# Patient Record
Sex: Male | Born: 1990 | Race: Black or African American | Hispanic: No | Marital: Single | Smoking: Current every day smoker
Health system: Southern US, Community
[De-identification: ages and names within clinical notes are randomized; demographics above are authoritative.]

---

## 2017-11-01 ENCOUNTER — Emergency Department (HOSPITAL_COMMUNITY)
Admission: EM | Admit: 2017-11-01 | Discharge: 2017-11-02 | Disposition: A | Discharge status: Home / Self Care | Payer: Self-pay | Attending: Emergency Medicine | Admitting: Emergency Medicine

## 2017-11-01 ENCOUNTER — Encounter (HOSPITAL_COMMUNITY): Payer: Self-pay | Admitting: Emergency Medicine

## 2017-11-01 ENCOUNTER — Other Ambulatory Visit: Payer: Self-pay

## 2017-11-01 ENCOUNTER — Emergency Department (HOSPITAL_COMMUNITY): Payer: Self-pay

## 2017-11-01 DIAGNOSIS — L02611 Cutaneous abscess of right foot: Secondary | ICD-10-CM | POA: Insufficient documentation

## 2017-11-01 MED ORDER — CEPHALEXIN 500 MG PO CAPS
1000.0000 mg | ORAL_CAPSULE | Freq: Once | ORAL | Status: AC
  Administered 2017-11-02: 1000 mg via ORAL
  Filled 2017-11-01: qty 2

## 2017-11-01 MED ORDER — OXYCODONE-ACETAMINOPHEN 5-325 MG PO TABS
1.0000 | ORAL_TABLET | Freq: Once | ORAL | Status: AC
  Administered 2017-11-02: 1 via ORAL
  Filled 2017-11-01: qty 1

## 2017-11-01 NOTE — ED Triage Notes (Signed)
Patient reports kicking chair yesterday. Wound between right fourth and fifth toes. Warmth, redness and swelling worsening to foot since yesterday. Pulse present in right foot. Ambulatory.

## 2017-11-01 NOTE — ED Provider Notes (Signed)
[facility]-EMERGENCY DEPT Provider Note   CSN: [id number] Arrival date & time: 11/01/17  1856     History   Chief Complaint Chief Complaint  Patient presents with  . Foot Injury    HPI Anthony Pineda is a 27 y.o. male.  Patient without significant medical history presents with right foot pain, swelling and redness since 2 days ago when he hit the foot against a piece of furniture causing injury to 4th and 5th toes. There was no wound or bleeding secondary to that injury. Since then, his foot has become warm to touch, red over the dorsal foot with blister developing at the interphalangeal space between the 4th and 5th toes. No fever, nausea or vomiting. No drainage.  The history is provided by the patient. No language interpreter was used.  Foot Injury   Pertinent negatives include no numbness.    History reviewed. No pertinent past medical history.  There are no active problems to display for this patient.   History reviewed. No pertinent surgical history.      Home Medications    Prior to Admission medications   Not on File    Family History No family history on file.  Social History Social History   Tobacco Use  . Smoking status: Not on file  Substance Use Topics  . Alcohol use: Not on file  . Drug use: Not on file     Allergies   Patient has no known allergies.   Review of Systems Review of Systems  Constitutional: Negative for chills and fever.  Gastrointestinal: Negative.  Negative for nausea and vomiting.  Musculoskeletal:       See HPI.  Skin: Positive for color change and wound.  Neurological: Negative.  Negative for weakness and numbness.     Physical Exam Updated Vital Signs BP (!) 141/84   Pulse 91   Temp 98.6 F (37 C) (Oral)   Resp 17   SpO2 100%   Physical Exam  Constitutional: He is oriented to person, place, and time. He appears well-developed and well-nourished.  Neck: Normal range of motion.    Pulmonary/Chest: Effort normal.  Musculoskeletal: Normal range of motion.  Right foot swollen distally to mid-forefoot, red, warm. No bony deformity.   Neurological: He is alert and oriented to person, place, and time.  Skin: Skin is warm and dry.  Erythema over mid- to distal dorsal forefoot on right. Thre is a blister at the interphalangeal space between 4th and 5th toes that is fluctuant, c/w abscess.  Psychiatric: He has a normal mood and affect.     ED Treatments / Results  Labs (all labs ordered are listed, but only abnormal results are displayed) Labs Reviewed - No data to display  EKG None  Radiology No results found.  Procedures .Marland KitchenIncision and Drainage Date/Time: 11/02/2017 1:38 AM Performed by: Elpidio Anis, PA-C Authorized by: Elpidio Anis, PA-C   Consent:    Consent obtained:  Verbal   Consent given by:  Patient Location:    Type:  Abscess   Location:  Lower extremity   Lower extremity location:  Foot   Foot location:  R foot Pre-procedure details:    Skin preparation:  Betadine Anesthesia (see MAR for exact dosages):    Anesthesia method:  Local infiltration   Local anesthetic:  Lidocaine 1% w/o epi Procedure type:    Complexity:  Simple Procedure details:    Needle aspiration: no     Incision types:  Stab incision  Scalpel blade:  11   Drainage:  Purulent and bloody   Drainage amount:  Moderate   Wound treatment:  Wound left open   Packing materials:  None Post-procedure details:    Patient tolerance of procedure:  Tolerated well, no immediate complications   (including critical care time)  Medications Ordered in ED Medications  oxyCODONE-acetaminophen (PERCOCET/ROXICET) 5-325 MG per tablet 1 tablet (has no administration in time range)  cephALEXin (KEFLEX) capsule 1,000 mg (has no administration in time range)     Initial Impression / Assessment and Plan / ED Course  I have reviewed the triage vital signs and the nursing  notes.  Pertinent labs & imaging results that were available during my care of the patient were reviewed by me and considered in my medical decision making (see chart for details).     Patient with right foot pain, swelling and redness with abscess at 4th-5th interphalangeal space, opened and drained per above procedure note. Imaging negative for bony injury.  Patient started on Keflex, given care instructions. Strongly encouraged 2 day recheck of infection.   Final Clinical Impressions(s) / ED Diagnoses   Final diagnoses:  None   1. Right foot abscess  ED Discharge Orders    None       Elpidio Anis, PA-C 11/02/17 0149    Gilda Crease, MD 11/02/17 [phone]

## 2017-11-02 MED ORDER — CEPHALEXIN 500 MG PO CAPS: 1000.0000 mg | ORAL_CAPSULE | Freq: Two times a day (BID) | ORAL | 0 refills | Status: DC

## 2017-11-02 MED ORDER — IBUPROFEN 600 MG PO TABS: 600.0000 mg | ORAL_TABLET | Freq: Four times a day (QID) | ORAL | 0 refills | Status: DC | PRN

## 2017-11-02 MED ORDER — LIDOCAINE HCL (PF) 1 % IJ SOLN
5.0000 mL | Freq: Once | INTRAMUSCULAR | Status: AC
  Administered 2017-11-02: 5 mL
  Filled 2017-11-02: qty 30

## 2017-11-02 NOTE — ED Notes (Signed)
Items requested at bedside

## 2017-11-02 NOTE — Discharge Instructions (Addendum)
Take the antibiotic as prescribed. Return here or go to the [facility] center for recheck of infection in 2 days. Return sooner with any worsening symptoms - increasing pain and redness, severe pain, fever.

## 2018-03-25 ENCOUNTER — Other Ambulatory Visit: Payer: Self-pay

## 2018-03-25 ENCOUNTER — Encounter (HOSPITAL_COMMUNITY): Payer: Self-pay | Admitting: Emergency Medicine

## 2018-03-25 ENCOUNTER — Inpatient Hospital Stay (HOSPITAL_COMMUNITY)
Admission: EM | Admit: 2018-03-25 | Discharge: 2018-03-27 | DRG: 439 | Disposition: A | Discharge status: Home / Self Care | Payer: Self-pay | Attending: Internal Medicine | Admitting: Internal Medicine

## 2018-03-25 ENCOUNTER — Emergency Department (HOSPITAL_COMMUNITY): Payer: Self-pay

## 2018-03-25 DIAGNOSIS — R402252 Coma scale, best verbal response, oriented, at arrival to emergency department: Secondary | ICD-10-CM | POA: Diagnosis present

## 2018-03-25 DIAGNOSIS — K852 Alcohol induced acute pancreatitis without necrosis or infection: Secondary | ICD-10-CM

## 2018-03-25 DIAGNOSIS — R402362 Coma scale, best motor response, obeys commands, at arrival to emergency department: Secondary | ICD-10-CM | POA: Diagnosis present

## 2018-03-25 DIAGNOSIS — R402142 Coma scale, eyes open, spontaneous, at arrival to emergency department: Secondary | ICD-10-CM | POA: Diagnosis present

## 2018-03-25 DIAGNOSIS — Z23 Encounter for immunization: Secondary | ICD-10-CM

## 2018-03-25 DIAGNOSIS — F1020 Alcohol dependence, uncomplicated: Secondary | ICD-10-CM | POA: Diagnosis present

## 2018-03-25 DIAGNOSIS — F17210 Nicotine dependence, cigarettes, uncomplicated: Secondary | ICD-10-CM | POA: Diagnosis present

## 2018-03-25 DIAGNOSIS — IMO0001 Reserved for inherently not codable concepts without codable children: Secondary | ICD-10-CM | POA: Diagnosis present

## 2018-03-25 DIAGNOSIS — K859 Acute pancreatitis without necrosis or infection, unspecified: Principal | ICD-10-CM | POA: Diagnosis present

## 2018-03-25 DIAGNOSIS — E871 Hypo-osmolality and hyponatremia: Secondary | ICD-10-CM | POA: Diagnosis present

## 2018-03-25 LAB — CBC WITH DIFFERENTIAL/PLATELET
Abs Immature Granulocytes: 0.09 10*3/uL — ABNORMAL HIGH (ref 0.00–0.07)
BASOS PCT: 0 %
Basophils Absolute: 0 10*3/uL (ref 0.0–0.1)
EOS ABS: 0 10*3/uL (ref 0.0–0.5)
EOS PCT: 0 %
HCT: 52.1 % — ABNORMAL HIGH (ref 39.0–52.0)
Hemoglobin: 17.3 g/dL — ABNORMAL HIGH (ref 13.0–17.0)
Immature Granulocytes: 1 %
Lymphocytes Relative: 11 %
Lymphs Abs: 1.5 10*3/uL (ref 0.7–4.0)
MCH: 30 pg (ref 26.0–34.0)
MCHC: 33.2 g/dL (ref 30.0–36.0)
MCV: 90.3 fL (ref 80.0–100.0)
MONO ABS: 1.1 10*3/uL — AB (ref 0.1–1.0)
Monocytes Relative: 8 %
NEUTROS ABS: 10.6 10*3/uL — AB (ref 1.7–7.7)
Neutrophils Relative %: 80 %
PLATELETS: 68 10*3/uL — AB (ref 150–400)
RBC: 5.77 MIL/uL (ref 4.22–5.81)
RDW: 12.4 % (ref 11.5–15.5)
WBC: 13.3 10*3/uL — AB (ref 4.0–10.5)
nRBC: 0 % (ref 0.0–0.2)

## 2018-03-25 LAB — COMPREHENSIVE METABOLIC PANEL
ALK PHOS: 113 U/L (ref 38–126)
ALT: 40 U/L (ref 0–44)
AST: 65 U/L — ABNORMAL HIGH (ref 15–41)
Albumin: 3.5 g/dL (ref 3.5–5.0)
Anion gap: 11 (ref 5–15)
BUN: 5 mg/dL — AB (ref 6–20)
CALCIUM: 8.6 mg/dL — AB (ref 8.9–10.3)
CO2: 25 mmol/L (ref 22–32)
CREATININE: 1 mg/dL (ref 0.61–1.24)
Chloride: 93 mmol/L — ABNORMAL LOW (ref 98–111)
GFR calc Af Amer: >60 mL/min (ref >60)
GFR calc non Af Amer: >60 mL/min (ref >60)
GLUCOSE: 124 mg/dL — AB (ref 70–99)
Potassium: 3.6 mmol/L (ref 3.5–5.1)
SODIUM: 129 mmol/L — AB (ref 135–145)
Total Bilirubin: 1.4 mg/dL — ABNORMAL HIGH (ref 0.3–1.2)
Total Protein: 7.6 g/dL (ref 6.5–8.1)

## 2018-03-25 LAB — URINALYSIS, ROUTINE W REFLEX MICROSCOPIC
Bacteria, UA: NONE SEEN
Bilirubin Urine: NEGATIVE
Glucose, UA: NEGATIVE mg/dL
Hgb urine dipstick: NEGATIVE
KETONES UR: 5 mg/dL — AB
Leukocytes,Ua: NEGATIVE
Nitrite: NEGATIVE
PH: 5 (ref 5.0–8.0)
PROTEIN: 30 mg/dL — AB
Specific Gravity, Urine: 1.02 (ref 1.005–1.030)

## 2018-03-25 LAB — PROTIME-INR
INR: 1.1 (ref 0.8–1.2)
Prothrombin Time: 14.3 seconds (ref 11.4–15.2)

## 2018-03-25 LAB — CBC
HCT: 52.2 % — ABNORMAL HIGH (ref 39.0–52.0)
Hemoglobin: 17.2 g/dL — ABNORMAL HIGH (ref 13.0–17.0)
MCH: 29.9 pg (ref 26.0–34.0)
MCHC: 33 g/dL (ref 30.0–36.0)
MCV: 90.8 fL (ref 80.0–100.0)
Platelets: 71 10*3/uL — ABNORMAL LOW (ref 150–400)
RBC: 5.75 MIL/uL (ref 4.22–5.81)
RDW: 12.3 % (ref 11.5–15.5)
WBC: 12.8 10*3/uL — ABNORMAL HIGH (ref 4.0–10.5)
nRBC: 0 % (ref 0.0–0.2)

## 2018-03-25 LAB — LIPASE, BLOOD: LIPASE: 390 U/L — AB (ref 11–51)

## 2018-03-25 MED ORDER — SODIUM CHLORIDE 0.9% FLUSH: 3.0000 mL | INTRAVENOUS | Status: DC | PRN

## 2018-03-25 MED ORDER — ONDANSETRON HCL 4 MG/2ML IJ SOLN
4.0000 mg | Freq: Four times a day (QID) | INTRAMUSCULAR | Status: DC | PRN
  Administered 2018-03-25: 4 mg via INTRAVENOUS
  Filled 2018-03-25: qty 2

## 2018-03-25 MED ORDER — ONDANSETRON HCL 4 MG PO TABS: 4.0000 mg | ORAL_TABLET | Freq: Four times a day (QID) | ORAL | Status: DC | PRN

## 2018-03-25 MED ORDER — IOHEXOL 300 MG/ML  SOLN
100.0000 mL | Freq: Once | INTRAMUSCULAR | Status: AC | PRN
  Administered 2018-03-25: 100 mL via INTRAVENOUS

## 2018-03-25 MED ORDER — LORAZEPAM 2 MG/ML IJ SOLN
0.0000 mg | Freq: Four times a day (QID) | INTRAMUSCULAR | Status: DC
  Administered 2018-03-26 (×2): 2 mg via INTRAVENOUS
  Filled 2018-03-25 (×2): qty 1

## 2018-03-25 MED ORDER — SODIUM CHLORIDE 0.9 % IV SOLN: 250.0000 mL | INTRAVENOUS | Status: DC | PRN

## 2018-03-25 MED ORDER — ZOLPIDEM TARTRATE 5 MG PO TABS
5.0000 mg | ORAL_TABLET | Freq: Every evening | ORAL | Status: DC | PRN
  Administered 2018-03-26: 5 mg via ORAL
  Filled 2018-03-25: qty 1

## 2018-03-25 MED ORDER — ACETAMINOPHEN 325 MG PO TABS: 650.0000 mg | ORAL_TABLET | Freq: Four times a day (QID) | ORAL | Status: DC | PRN

## 2018-03-25 MED ORDER — POTASSIUM CHLORIDE 2 MEQ/ML IV SOLN
INTRAVENOUS | Status: DC
  Administered 2018-03-25 – 2018-03-27 (×6): via INTRAVENOUS
  Filled 2018-03-25 (×12): qty 1000

## 2018-03-25 MED ORDER — LORAZEPAM 2 MG/ML IJ SOLN: 0.0000 mg | Freq: Two times a day (BID) | INTRAMUSCULAR | Status: DC

## 2018-03-25 MED ORDER — LORAZEPAM 1 MG PO TABS: 1.0000 mg | ORAL_TABLET | Freq: Four times a day (QID) | ORAL | Status: DC | PRN

## 2018-03-25 MED ORDER — PROMETHAZINE HCL 25 MG/ML IJ SOLN
12.5000 mg | Freq: Once | INTRAMUSCULAR | Status: AC
  Administered 2018-03-25: 12.5 mg via INTRAVENOUS
  Filled 2018-03-25: qty 1

## 2018-03-25 MED ORDER — SODIUM CHLORIDE 0.9% FLUSH
3.0000 mL | Freq: Once | INTRAVENOUS | Status: AC
  Administered 2018-03-25: 3 mL via INTRAVENOUS

## 2018-03-25 MED ORDER — HEPARIN SODIUM (PORCINE) 5000 UNIT/ML IJ SOLN
5000.0000 [IU] | Freq: Three times a day (TID) | INTRAMUSCULAR | Status: DC
  Administered 2018-03-25 – 2018-03-27 (×5): 5000 [IU] via SUBCUTANEOUS
  Filled 2018-03-25 (×6): qty 1

## 2018-03-25 MED ORDER — SODIUM CHLORIDE 0.9 % IV BOLUS
1000.0000 mL | Freq: Once | INTRAVENOUS | Status: AC
  Administered 2018-03-25: 1000 mL via INTRAVENOUS

## 2018-03-25 MED ORDER — THIAMINE HCL 100 MG/ML IJ SOLN
100.0000 mg | Freq: Every day | INTRAMUSCULAR | Status: DC
  Administered 2018-03-25: 100 mg via INTRAVENOUS
  Filled 2018-03-25 (×2): qty 2

## 2018-03-25 MED ORDER — MORPHINE SULFATE (PF) 2 MG/ML IV SOLN
2.0000 mg | INTRAVENOUS | Status: DC | PRN
  Administered 2018-03-25 – 2018-03-27 (×9): 2 mg via INTRAVENOUS
  Filled 2018-03-25 (×9): qty 1

## 2018-03-25 MED ORDER — LORAZEPAM 2 MG/ML IJ SOLN: 1.0000 mg | Freq: Four times a day (QID) | INTRAMUSCULAR | Status: DC | PRN

## 2018-03-25 MED ORDER — MORPHINE SULFATE (PF) 4 MG/ML IV SOLN
4.0000 mg | Freq: Once | INTRAVENOUS | Status: AC
  Administered 2018-03-25: 4 mg via INTRAVENOUS
  Filled 2018-03-25: qty 1

## 2018-03-25 MED ORDER — ACETAMINOPHEN 650 MG RE SUPP: 650.0000 mg | Freq: Four times a day (QID) | RECTAL | Status: DC | PRN

## 2018-03-25 MED ORDER — ONDANSETRON HCL 4 MG/2ML IJ SOLN
4.0000 mg | Freq: Once | INTRAMUSCULAR | Status: AC
  Administered 2018-03-25: 4 mg via INTRAVENOUS
  Filled 2018-03-25: qty 2

## 2018-03-25 MED ORDER — SODIUM CHLORIDE 0.9% FLUSH
3.0000 mL | Freq: Two times a day (BID) | INTRAVENOUS | Status: DC
  Administered 2018-03-25: 3 mL via INTRAVENOUS

## 2018-03-25 MED ORDER — ADULT MULTIVITAMIN W/MINERALS CH
1.0000 | ORAL_TABLET | Freq: Every day | ORAL | Status: DC
  Administered 2018-03-26 – 2018-03-27 (×2): 1 via ORAL
  Filled 2018-03-25 (×2): qty 1

## 2018-03-25 MED ORDER — OXYCODONE HCL 5 MG PO TABS
5.0000 mg | ORAL_TABLET | ORAL | Status: DC | PRN
  Administered 2018-03-26 – 2018-03-27 (×2): 5 mg via ORAL
  Filled 2018-03-25 (×2): qty 1

## 2018-03-25 MED ORDER — FOLIC ACID 1 MG PO TABS
1.0000 mg | ORAL_TABLET | Freq: Every day | ORAL | Status: DC
  Administered 2018-03-26 – 2018-03-27 (×2): 1 mg via ORAL
  Filled 2018-03-25 (×2): qty 1

## 2018-03-25 MED ORDER — VITAMIN B-1 100 MG PO TABS
100.0000 mg | ORAL_TABLET | Freq: Every day | ORAL | Status: DC
  Administered 2018-03-26 – 2018-03-27 (×2): 100 mg via ORAL
  Filled 2018-03-25 (×2): qty 1

## 2018-03-25 NOTE — ED Notes (Signed)
8M nurse ready for pt and is requesting MD put in CIWA protocol.  Text page to Dr. Willette Pa.

## 2018-03-25 NOTE — ED Provider Notes (Signed)
[facility] EMERGENCY DEPARTMENT Provider Note   CSN: [id number] Arrival date & time: 03/25/18  1401    History   Chief Complaint Chief Complaint  Patient presents with  . Abdominal Pain  . Emesis  . Nausea    HPI Anthony Pineda is a 28 y.o. male presenting today for abdominal pain, nausea and vomiting.  Patient reports that his symptoms began yesterday, gradual onset worsening since time of onset.  He endorses a moderate intensity sharp pain around his umbilicus that radiates to the right side of his abdomen constant worsened with palpation and movement without alleviating factors.  Patient states he has multiple episodes of vomiting since onset of pain, initially yellow however has transitioned to clear.  He is unable to eat or drink without vomiting.  Patient denies fever, chills, diarrhea, chest pain/shortness of breath or any other concerns today.  Patient states that he has had similar domino pain in the past however never this severe and never accompanied with nausea or vomiting.  Of note patient states that 1 of his acquaintances recently tested positive for influenza.  He has not taken his flu shot this year.  Patient denies any other concerns today.    HPI  History reviewed. No pertinent past medical history.  There are no active problems to display for this patient.   History reviewed. No pertinent surgical history.      Home Medications    Prior to Admission medications   Medication Sig Start Date End Date Taking? Authorizing Provider  cephALEXin (KEFLEX) 500 MG capsule Take 2 capsules (1,000 mg total) by mouth 2 (two) times daily. 11/02/17   Elpidio Anis, PA-C  ibuprofen (ADVIL,MOTRIN) 600 MG tablet Take 1 tablet (600 mg total) by mouth every 6 (six) hours as needed. 11/02/17   Elpidio Anis, PA-C    Family History No family history on file.  Social History Social History   Tobacco Use  . Smoking status: Current Every Day Smoker   . Smokeless tobacco: Current User  Substance Use Topics  . Alcohol use: Yes  . Drug use: Not Currently     Allergies   Patient has no known allergies.   Review of Systems Review of Systems  Constitutional: Negative.  Negative for chills and fever.  HENT: Negative.  Negative for congestion, rhinorrhea, trouble swallowing and voice change.   Respiratory: Negative.  Negative for cough and shortness of breath.   Cardiovascular: Negative.  Negative for chest pain.  Gastrointestinal: Positive for abdominal pain, nausea and vomiting. Negative for blood in stool and diarrhea.  Genitourinary: Negative.  Negative for difficulty urinating, discharge, dysuria, flank pain, hematuria, scrotal swelling and testicular pain.  Musculoskeletal: Negative.  Negative for back pain, neck pain and neck stiffness.  Neurological: Negative.  Negative for syncope, weakness and headaches.  All other systems reviewed and are negative.  Physical Exam Updated Vital Signs BP 127/79 (BP Location: Right Arm)   Pulse 97   Temp 99.2 F (37.3 C) (Oral)   Resp 16   Ht 5\' 10"  (1.778 m)   Wt 66.7 kg   SpO2 99%   BMI 21.09 kg/m   Physical Exam Constitutional:      General: He is not in acute distress.    Appearance: Normal appearance. He is well-developed. He is not ill-appearing or diaphoretic.  HENT:     Head: Normocephalic and atraumatic.     Jaw: There is normal jaw occlusion.     Right Ear: External ear normal.  Left Ear: External ear normal.     Nose: Nose normal.     Mouth/Throat:     Mouth: Mucous membranes are moist.     Pharynx: Oropharynx is clear. Uvula midline.  Eyes:     General: Vision grossly intact. Gaze aligned appropriately.     Pupils: Pupils are equal, round, and reactive to light.  Neck:     Musculoskeletal: Full passive range of motion without pain, normal range of motion and neck supple.     Trachea: Trachea and phonation normal. No tracheal tenderness or tracheal deviation.      Meningeal: Brudzinski's sign absent.  Cardiovascular:     Rate and Rhythm: Normal rate and regular rhythm.     Pulses:          Radial pulses are 2+ on the right side and 2+ on the left side.       Dorsalis pedis pulses are 2+ on the right side and 2+ on the left side.       Posterior tibial pulses are 2+ on the right side and 2+ on the left side.     Heart sounds: Normal heart sounds.  Pulmonary:     Effort: Pulmonary effort is normal. No respiratory distress.  Chest:     Chest wall: No deformity, tenderness or crepitus.  Abdominal:     General: Abdomen is flat. There is no distension.     Palpations: Abdomen is soft.     Tenderness: There is abdominal tenderness in the right upper quadrant, right lower quadrant and periumbilical area. There is no right CVA tenderness, left CVA tenderness, guarding or rebound. Positive signs include Rovsing's sign and McBurney's sign. Negative signs include Murphy's sign.  Musculoskeletal: Normal range of motion.     Right lower leg: Normal. He exhibits no tenderness. No edema.     Left lower leg: Normal. He exhibits no tenderness. No edema.  Feet:     Right foot:     Protective Sensation: 3 sites tested. 3 sites sensed.     Left foot:     Protective Sensation: 3 sites tested. 3 sites sensed.  Skin:    General: Skin is warm and dry.  Neurological:     Mental Status: He is alert.     GCS: GCS eye subscore is 4. GCS verbal subscore is 5. GCS motor subscore is 6.     Comments: Speech is clear and goal oriented, follows commands Major Cranial nerves without deficit, no facial droop Moves extremities without ataxia, coordination intact  Psychiatric:        Behavior: Behavior normal.    ED Treatments / Results  Labs (all labs ordered are listed, but only abnormal results are displayed) Labs Reviewed  LIPASE, BLOOD - Abnormal; Notable for the following components:      Result Value   Lipase 390 (*)    All other components within normal limits   COMPREHENSIVE METABOLIC PANEL - Abnormal; Notable for the following components:   Sodium 129 (*)    Chloride 93 (*)    Glucose, Bld 124 (*)    BUN 5 (*)    Calcium 8.6 (*)    AST 65 (*)    Total Bilirubin 1.4 (*)    All other components within normal limits  CBC - Abnormal; Notable for the following components:   WBC 12.8 (*)    Hemoglobin 17.2 (*)    HCT 52.2 (*)    Platelets 71 (*)    All  other components within normal limits  URINALYSIS, ROUTINE W REFLEX MICROSCOPIC  PROTIME-INR  CBC WITH DIFFERENTIAL/PLATELET    EKG None  Radiology No results found.  Procedures Procedures (including critical care time)  Medications Ordered in ED Medications  sodium chloride flush (NS) 0.9 % injection 3 mL (3 mLs Intravenous Given 03/25/18 1500)  sodium chloride 0.9 % bolus 1,000 mL (1,000 mLs Intravenous New Bag/Given 03/25/18 1502)  morphine 4 MG/ML injection 4 mg (4 mg Intravenous Given 03/25/18 1504)  ondansetron (ZOFRAN) injection 4 mg (4 mg Intravenous Given 03/25/18 1504)  iohexol (OMNIPAQUE) 300 MG/ML solution 100 mL (100 mLs Intravenous Contrast Given 03/25/18 1557)     Initial Impression / Assessment and Plan / ED Course  I have reviewed the triage vital signs and the nursing notes.  Pertinent labs & imaging results that were available during my care of the patient were reviewed by me and considered in my medical decision making (see chart for details).    28 year old male with no pertinent past medical history presenting today for abdominal pain nausea and vomiting began yesterday.  On arrival patient is well-appearing in no acute distress.  Endorses multiple episodes of nausea and vomiting and worsening abdominal pain.  Examination with periumbilical abdominal tenderness and some right lower quadrant tenderness.  No Murphy sign on examination.  Concern clinically for possible early appendicitis.  Lab work ordered, CT scan ordered.  Fluids and pain control/Zofran ordered. -  CBC with leukocytosis and platelet count of 71 CMP notable for sodium 129, receiving normal saline bolus, mildly elevated AST Lipase 390 Urinalysis pending CT scan pending - Discussed with Dr. Clarice PolePfeifer, CBC with differential, PT/INR ordered. - He waited after his return from CT scan, he states his pain has improved and he has had no further vomiting.  Further history reveals the patient is a daily drinker, he reports drinking 2 [employer] hard lemonade's daily.  He is aware of need for further blood work and is agreeable. - [facility] at shift change.  At this time PT/INR, CBC with differential and CT abdomen pelvis is pending.   Note: Portions of this report may have been transcribed using voice recognition software. Every effort was made to ensure accuracy; however, inadvertent computerized transcription errors may still be present. Final Clinical Impressions(s) / ED Diagnoses   Final diagnoses:  None    ED Discharge Orders    None       Elizabeth PalauMorelli, Madylin Fairbank A, PA-C 03/25/18 1627    Arby BarrettePfeiffer, Marcy, MD 03/26/18 1214

## 2018-03-25 NOTE — ED Notes (Signed)
Patient transported to CT 

## 2018-03-25 NOTE — H&P (Signed)
Unassigned Admission History and Physical    Anthony Pineda [id number]:[id number] DOB: 08/15/1990 DOA: 03/25/2018  PCP: Patient, No Pcp Per  Patient coming from: Home  I have personally briefly reviewed patient's old medical records in [facility] Link  Chief Complaint: Abdominal pain nausea and vomiting  HPI: Anthony Pineda is a 28 y.o. male with no prior medical history who presents the emergency department with complaints of abdominal pain nausea and vomiting that started 3 days ago and has worsened since then.  He reports drinking alcohol on a daily basis but has not been able to drink anything in 3 days due to the nausea vomiting and severe abdominal pain.  Pain was gradual in onset since time of onset.  It is moderate intensity sharp pain around his umbilicus that radiates to the right side of his abdomen.  It is constant and worsened with palpation and movement.  Nothing makes it better.  Unable to eat or drink without vomiting.  He denies fevers, chills, diarrhea, chest pain, shortness of breath or any other concerns today.  He has had similar abdominal pain in the past but has never been this severe and never was accompanied with nausea or vomiting.  ED Course: CT scan showed acute pancreatitis patient referred to me for further evaluation and management  Review of Systems: As per HPI otherwise all other systems reviewed and  negative  History reviewed. No pertinent past medical history.  History reviewed. No pertinent surgical history.  Social History   Social History Narrative   Not on file     reports that he has been smoking. He uses smokeless tobacco. He reports current alcohol use. He reports previous drug use. Drinks 2 large hard lemonade's daily and has done so for years No Known Allergies  Family History  Problem Relation Age of Onset   Alcohol abuse Neg Hx    No known family medical problems  Prior to Admission medications   Medication Sig Start Date End Date  Taking? Authorizing Provider  cephALEXin (KEFLEX) 500 MG capsule Take 2 capsules (1,000 mg total) by mouth 2 (two) times daily. 11/02/17   Elpidio Anis, PA-C  ibuprofen (ADVIL,MOTRIN) 600 MG tablet Take 1 tablet (600 mg total) by mouth every 6 (six) hours as needed. 11/02/17   Elpidio Anis, PA-C    Physical Exam:  Constitutional: NAD, calm, comfortable Vitals:   03/25/18 1411 03/25/18 1427 03/25/18 1620  BP: 137/85  127/79  Pulse: (!) 130  97  Resp: 18  16  Temp: 98.5 F (36.9 C)  99.2 F (37.3 C)  TempSrc: Oral  Oral  SpO2: 100%  99%  Weight:  66.7 kg   Height:  5\' 10"  (1.778 m)    Eyes: PERRL, lids and conjunctivae normal ENMT: Mucous membranes are moist. Posterior pharynx clear of any exudate or lesions.Normal dentition.  Neck: normal, supple, no masses, no thyromegaly Respiratory: clear to auscultation bilaterally, no wheezing, no crackles. Normal respiratory effort. No accessory muscle use.  Cardiovascular: Tachycardic but regular rate and rhythm, no murmurs / rubs / gallops. No extremity edema. 2+ pedal pulses. No carotid bruits.  Abdomen: Soft diffusely tender but mostly in the epigastrium nondistended, no hepatosplenomegaly, bowel sounds hypoactive Musculoskeletal: no clubbing / cyanosis. No joint deformity upper and lower extremities. Good ROM, no contractures. Normal muscle tone.  Skin: no rashes, lesions, ulcers. No induration Neurologic: CN 2-12 grossly intact. Sensation intact, DTR normal. Strength 5/5 in all 4.  Psychiatric: Normal judgment and insight. Alert and oriented  x 3. Normal mood.    Labs on Admission: I have personally reviewed following labs and imaging studies  CBC: Recent Labs  Lab 03/25/18 1422  WBC 13.3*   12.8*  NEUTROABS 10.6*  HGB 17.3*   17.2*  HCT 52.1*   52.2*  MCV 90.3   90.8  PLT 68*   71*   Basic Metabolic Panel: Recent Labs  Lab 03/25/18 1422  NA 129*  K 3.6  CL 93*  CO2 25  GLUCOSE 124*  BUN 5*  CREATININE 1.00    CALCIUM 8.6*   GFR: Estimated Creatinine Clearance: 103.8 mL/min (by C-G formula based on SCr of 1 mg/dL). Liver Function Tests: Recent Labs  Lab 03/25/18 1422  AST 65*  ALT 40  ALKPHOS 113  BILITOT 1.4*  PROT 7.6  ALBUMIN 3.5   Recent Labs  Lab 03/25/18 1422  LIPASE 390*   Coagulation Profile: Recent Labs  Lab 03/25/18 1653  INR 1.1   Urine analysis:    Component Value Date/Time   COLORURINE AMBER (A) 03/25/2018 1430   APPEARANCEUR HAZY (A) 03/25/2018 1430   LABSPEC 1.020 03/25/2018 1430   PHURINE 5.0 03/25/2018 1430   GLUCOSEU NEGATIVE 03/25/2018 1430   HGBUR NEGATIVE 03/25/2018 1430   BILIRUBINUR NEGATIVE 03/25/2018 1430   KETONESUR 5 (A) 03/25/2018 1430   PROTEINUR 30 (A) 03/25/2018 1430   NITRITE NEGATIVE 03/25/2018 1430   LEUKOCYTESUR NEGATIVE 03/25/2018 1430    Radiological Exams on Admission: Ct Abdomen Pelvis W Contrast  Result Date: 03/25/2018 CLINICAL DATA:  28 year old male with abdominal pain since yesterday with nausea vomiting. Pain radiating to the right flank. EXAM: CT ABDOMEN AND PELVIS WITH CONTRAST TECHNIQUE: Multidetector CT imaging of the abdomen and pelvis was performed using the standard protocol following bolus administration of intravenous contrast. CONTRAST:  OMNIPAQUE IOHEXOL 300 MG/ML  SOLN COMPARISON:  None. FINDINGS: Lower chest: Small layering pleural effusions. No pericardial effusion. Minor lung base atelectasis. Hepatobiliary: Small volume perihepatic free fluid. Preserved liver enhancement. Gallbladder within normal limits. No dilated bile ducts. Pancreas: Enlarged, heterogeneously enhancing and inflamed (series 3, image 24). Inflammation throughout the lesser sac. Pancreatic parenchymal involvement most pronounced in the body. No definite areas of pancreatic necrosis. Scattered non organized free-fluid in the abdomen, most pronounced in the left upper quadrant, at the root of the small bowel mesentery, and bilateral pericolic  gutters. Spleen: Negative aside from regional inflammation which appears related to the pancreas. Adrenals/Urinary Tract: Normal adrenal glands. Bilateral renal enhancement is symmetric and normal. No hydronephrosis. Proximal ureters appear normal. Unremarkable urinary bladder. Stomach/Bowel: Generalized mesenteric stranding throughout the abdomen, but more so in the small bowel. The appendix is within normal limits on coronal image 47. The cecum is located in the midline. The large bowel is decompressed from the splenic flexure distally. The transverse colon and right colon are mildly distended with gas and fluid. Fluid-filled but nondilated small bowel loops. Secondary inflammation of the stomach and duodenum related to the pancreas. No free air. Vascular/Lymphatic: Major arterial structures are patent and appear normal. Portal venous system including the splenic vein is patent. No lymphadenopathy. Reproductive: Negative. Other: Small to moderate pelvic free fluid with simple fluid density. Musculoskeletal: Negative. IMPRESSION: 1. Acute Pancreatitis. Generalized abdominal inflammation and scattered free fluid. No pancreatic necrosis or organized pseudocyst at this time. 2. Small layering pleural effusions. Electronically Signed   By: Odessa Fleming M.D.   On: 03/25/2018 16:28     Assessment/Plan Principal Problem:   Acute pancreatitis Active  Problems:   Hyponatremia   Alcoholism /alcohol abuse (HCC)   1.  Acute pancreatitis: Unable to hold anything down.  Pain is a little bit improved with IV pain medication obtained in the emergency department.  Denies any appetite at the present time.  Will admit the patient to the MedSurg floor.  Start IV fluids aggressively to 150 mL/h.  Provide pain medications.  Monitor response to treatment.  Recheck lipase in a.m.  2.  Hyponatremia: Likely related to nausea and vomiting.  We will hydrate patient and recheck BMP in a.m.  3.  Alcoholism: With a medical problem  associated with his alcohol use to find alcoholism.  He open to discussion about quitting alcohol.  Splane to him that his pancreatitis will return if he continues to drink.    DVT prophylaxis: Subcu q. heparin Code Status: Full code Family Communication: Spoke with patient and his friend who are at bedside.  Patient retains capacity. Disposition Plan: Likely home in 3 to 4 days Consults called: None Admission status: Inpatient   Lahoma Crocker MD FACP [facility] Pager [phone]  How to contact the [facility] Attending or Consulting provider 7A - 7P or covering provider during after hours 7P -7A, for this patient?  1. Check the care team in [facility] and look for a) attending/consulting [facility] provider listed and b) the [facility] team listed 2. Log into [email or web address] and use [place]'s universal password to access. If you do not have the password, please contact the hospital operator. 3. Locate the [facility] provider you are looking for under [facility] and page to a number that you can be directly reached. 4. If you still have difficulty reaching the provider, please page the [facility] (Director on Call) for the Hospitalists listed on amion for assistance.  If 7PM-7AM, please contact night-coverage [email or web address] Password [facility]  03/25/2018, 6:42 PM

## 2018-03-25 NOTE — ED Triage Notes (Signed)
Pt. Stated, Ive had sharp stomach pains since yesterday with N/V

## 2018-03-25 NOTE — ED Notes (Signed)
Pt in CT.

## 2018-03-25 NOTE — ED Notes (Signed)
ED TO INPATIENT HANDOFF REPORT  ED Nurse Name and Phone #: [phone]  S Name/Age/Gender Anthony Pineda 28 y.o. male Room/Bed: 011C/011C  Code Status   Code Status: Full Code  Home/SNF/Other Home  Is this baseline?   Triage Complete: Triage complete  Chief Complaint abd pain  Triage Note Pt. Stated, Anthony Pineda had sharp stomach pains since yesterday with N/V   Allergies No Known Allergies  Level of Care/Admitting Diagnosis ED Disposition    ED Disposition Condition Comment   Admit  Hospital Area: [facility] [[id number]]  Level of Care: Med-Surg [16]  Diagnosis: Acute pancreatitis [577.0.ICD-9-CM]  Admitting Physician: Lahoma Crocker [[id number]]  Attending Physician: Lahoma Crocker [[id number]]  Estimated length of stay: past midnight tomorrow  Certification:: I certify this patient will need inpatient services for at least 2 midnights  PT Class (Do Not Modify): Inpatient [101]  PT Acc Code (Do Not Modify): Private [1]       B Medical/Surgery History History reviewed. No pertinent past medical history. History reviewed. No pertinent surgical history.   A IV Location/Drains/Wounds Patient Lines/Drains/Airways Status   Active Line/Drains/Airways    Name:   Placement date:   Placement time:   Site:   Days:   Peripheral IV 03/25/18   03/25/18    1500    -   less than 1          Intake/Output Last 24 hours  Intake/Output Summary (Last 24 hours) at 03/25/2018 1911 Last data filed at 03/25/2018 1653 Gross per 24 hour  Intake 1000 ml  Output -  Net 1000 ml    Labs/Imaging Results for orders placed or performed during the hospital encounter of 03/25/18 (from the past 48 hour(s))  Lipase, blood     Status: Abnormal   Collection Time: 03/25/18  2:22 PM  Result Value Ref Range   Lipase 390 (H) 11 - 51 U/L    Comment: Performed at [facility], [address], [place], [place] [address]  Comprehensive metabolic panel     Status: Abnormal   Collection Time: 03/25/18  2:22 PM  Result Value Ref Range   Sodium 129 (L) 135 - 145 mmol/L   Potassium 3.6 3.5 - 5.1 mmol/L   Chloride 93 (L) 98 - 111 mmol/L   CO2 25 22 - 32 mmol/L   Glucose, Bld 124 (H) 70 - 99 mg/dL   BUN 5 (L) 6 - 20 mg/dL   Creatinine, Ser 6.96 0.61 - 1.24 mg/dL   Calcium 8.6 (L) 8.9 - 10.3 mg/dL   Total Protein 7.6 6.5 - 8.1 g/dL   Albumin 3.5 3.5 - 5.0 g/dL   AST 65 (H) 15 - 41 U/L   ALT 40 0 - 44 U/L   Alkaline Phosphatase 113 38 - 126 U/L   Total Bilirubin 1.4 (H) 0.3 - 1.2 mg/dL   GFR calc non Af Amer >60 >60 mL/min   GFR calc Af Amer >60 >60 mL/min   Anion gap 11 5 - 15    Comment: Performed at [facility], [address], [place], [place] [address]  CBC     Status: Abnormal   Collection Time: 03/25/18  2:22 PM  Result Value Ref Range   WBC 12.8 (H) 4.0 - 10.5 K/uL   RBC 5.75 4.22 - 5.81 MIL/uL   Hemoglobin 17.2 (H) 13.0 - 17.0 g/dL   HCT 41.3 (H) 24.4 - 01.0 %   MCV 90.8 80.0 - 100.0 fL  MCH 29.9 26.0 - 34.0 pg   MCHC 33.0 30.0 - 36.0 g/dL   RDW 40.9 81.1 - 91.4 %   Platelets 71 (L) 150 - 400 K/uL    Comment: REPEATED TO VERIFY PLATELET COUNT CONFIRMED BY SMEAR SPECIMEN CHECKED FOR CLOTS Immature Platelet Fraction may be clinically indicated, consider ordering this additional test [id number]    nRBC 0.0 0.0 - 0.2 %    Comment: Performed at [facility], [address] [place], [place] [address]  CBC with Differential/Platelet     Status: Abnormal   Collection Time: 03/25/18  2:22 PM  Result Value Ref Range   WBC 13.3 (H) 4.0 - 10.5 K/uL   RBC 5.77 4.22 - 5.81 MIL/uL   Hemoglobin 17.3 (H) 13.0 - 17.0 g/dL   HCT 78.4 (H) 69.6 - 29.5 %   MCV 90.3 80.0 - 100.0 fL   MCH 30.0 26.0 - 34.0 pg   MCHC 33.2 30.0 - 36.0 g/dL   RDW 28.4 13.2 - 44.0 %   Platelets 68 (L) 150 - 400 K/uL    Comment: REPEATED TO VERIFY SPECIMEN CHECKED FOR CLOTS Immature Platelet Fraction may be clinically indicated, consider ordering this additional  test [id number] CONSISTENT WITH PREVIOUS RESULT    nRBC 0.0 0.0 - 0.2 %   Neutrophils Relative % 80 %   Neutro Abs 10.6 (H) 1.7 - 7.7 K/uL   Lymphocytes Relative 11 %   Lymphs Abs 1.5 0.7 - 4.0 K/uL   Monocytes Relative 8 %   Monocytes Absolute 1.1 (H) 0.1 - 1.0 K/uL   Eosinophils Relative 0 %   Eosinophils Absolute 0.0 0.0 - 0.5 K/uL   Basophils Relative 0 %   Basophils Absolute 0.0 0.0 - 0.1 K/uL   Immature Granulocytes 1 %   Abs Immature Granulocytes 0.09 (H) 0.00 - 0.07 K/uL    Comment: Performed at [facility], [address] [place], [place] [address]  Urinalysis, Routine w reflex microscopic     Status: Abnormal   Collection Time: 03/25/18  2:30 PM  Result Value Ref Range   Color, Urine AMBER (A) YELLOW    Comment: BIOCHEMICALS MAY BE AFFECTED BY COLOR   APPearance HAZY (A) CLEAR   Specific Gravity, Urine 1.020 1.005 - 1.030   pH 5.0 5.0 - 8.0   Glucose, UA NEGATIVE NEGATIVE mg/dL   Hgb urine dipstick NEGATIVE NEGATIVE   Bilirubin Urine NEGATIVE NEGATIVE   Ketones, ur 5 (A) NEGATIVE mg/dL   Protein, ur 30 (A) NEGATIVE mg/dL   Nitrite NEGATIVE NEGATIVE   Leukocytes,Ua NEGATIVE NEGATIVE   RBC / HPF 0-5 0 - 5 RBC/hpf   WBC, UA 6-10 0 - 5 WBC/hpf   Bacteria, UA NONE SEEN NONE SEEN   Mucus PRESENT    Hyaline Casts, UA PRESENT     Comment: Performed at [facility], [address] [place], [place] [address]  Protime-INR     Status: None   Collection Time: 03/25/18  4:53 PM  Result Value Ref Range   Prothrombin Time 14.3 11.4 - 15.2 seconds   INR 1.1 0.8 - 1.2    Comment: (NOTE) INR goal varies based on device and disease states. Performed at [facility], [address], [place], [place] [address]    Ct Abdomen Pelvis W Contrast  Result Date: 03/25/2018 CLINICAL DATA:  28 year old male with abdominal pain since yesterday with nausea vomiting. Pain radiating to the right flank. EXAM: CT ABDOMEN AND  PELVIS WITH CONTRAST TECHNIQUE: Multidetector CT  imaging of the abdomen and pelvis was performed using the standard protocol following bolus administration of intravenous contrast. CONTRAST:  OMNIPAQUE IOHEXOL 300 MG/ML  SOLN COMPARISON:  None. FINDINGS: Lower chest: Small layering pleural effusions. No pericardial effusion. Minor lung base atelectasis. Hepatobiliary: Small volume perihepatic free fluid. Preserved liver enhancement. Gallbladder within normal limits. No dilated bile ducts. Pancreas: Enlarged, heterogeneously enhancing and inflamed (series 3, image 24). Inflammation throughout the lesser sac. Pancreatic parenchymal involvement most pronounced in the body. No definite areas of pancreatic necrosis. Scattered non organized free-fluid in the abdomen, most pronounced in the left upper quadrant, at the root of the small bowel mesentery, and bilateral pericolic gutters. Spleen: Negative aside from regional inflammation which appears related to the pancreas. Adrenals/Urinary Tract: Normal adrenal glands. Bilateral renal enhancement is symmetric and normal. No hydronephrosis. Proximal ureters appear normal. Unremarkable urinary bladder. Stomach/Bowel: Generalized mesenteric stranding throughout the abdomen, but more so in the small bowel. The appendix is within normal limits on coronal image 47. The cecum is located in the midline. The large bowel is decompressed from the splenic flexure distally. The transverse colon and right colon are mildly distended with gas and fluid. Fluid-filled but nondilated small bowel loops. Secondary inflammation of the stomach and duodenum related to the pancreas. No free air. Vascular/Lymphatic: Major arterial structures are patent and appear normal. Portal venous system including the splenic vein is patent. No lymphadenopathy. Reproductive: Negative. Other: Small to moderate pelvic free fluid with simple fluid density. Musculoskeletal: Negative. IMPRESSION: 1. Acute Pancreatitis. Generalized abdominal inflammation and  scattered free fluid. No pancreatic necrosis or organized pseudocyst at this time. 2. Small layering pleural effusions. Electronically Signed   By: Odessa Fleming M.D.   On: 03/25/2018 16:28    Pending Labs Unresulted Labs (From admission, onward)    Start     Ordered   03/26/18 0500  Basic metabolic panel  Tomorrow morning,   R     03/25/18 1840   03/26/18 0500  CBC  Tomorrow morning,   R     03/25/18 1840   03/26/18 0500  Lipase, blood  Tomorrow morning,   R     03/25/18 1840   03/25/18 1832  CBC  (heparin)  Once,   R    Comments:  Baseline for heparin therapy IF NOT ALREADY DRAWN.  Notify MD if PLT < 100 K.    03/25/18 1840   03/25/18 1832  Creatinine, serum  (heparin)  Once,   R    Comments:  Baseline for heparin therapy IF NOT ALREADY DRAWN.    03/25/18 1840   03/25/18 1831  HIV antibody (Routine Testing)  Once,   R     03/25/18 1840   03/25/18 1655  CBC with Differential  Add-on,   STAT    Comments:  Please add on Differential    03/25/18 1655          Vitals/Pain Today's Vitals   03/25/18 1411 03/25/18 1427 03/25/18 1509 03/25/18 1620  BP: 137/85   127/79  Pulse: (!) 130   97  Resp: 18   16  Temp: 98.5 F (36.9 C)   99.2 F (37.3 C)  TempSrc: Oral   Oral  SpO2: 100%   99%  Weight:  66.7 kg    Height:  5\' 10"  (1.778 m)    PainSc:  10-Worst pain ever 6      Isolation Precautions No active isolations  Medications Medications  heparin injection 5,000 Units (has no administration in time range)  lactated ringers 1,000 mL with potassium chloride 20 mEq infusion (has no administration in time range)  sodium chloride flush (NS) 0.9 % injection 3 mL (has no administration in time range)  sodium chloride flush (NS) 0.9 % injection 3 mL (has no administration in time range)  0.9 %  sodium chloride infusion (has no administration in time range)  acetaminophen (TYLENOL) tablet 650 mg (has no administration in time range)    Or  acetaminophen (TYLENOL) suppository 650 mg  (has no administration in time range)  oxyCODONE (Oxy IR/ROXICODONE) immediate release tablet 5 mg (has no administration in time range)  zolpidem (AMBIEN) tablet 5 mg (has no administration in time range)  ondansetron (ZOFRAN) tablet 4 mg (has no administration in time range)    Or  ondansetron (ZOFRAN) injection 4 mg (has no administration in time range)  morphine 2 MG/ML injection 2 mg (has no administration in time range)  sodium chloride flush (NS) 0.9 % injection 3 mL (3 mLs Intravenous Given 03/25/18 1500)  sodium chloride 0.9 % bolus 1,000 mL (0 mLs Intravenous Stopped 03/25/18 1653)  morphine 4 MG/ML injection 4 mg (4 mg Intravenous Given 03/25/18 1504)  ondansetron (ZOFRAN) injection 4 mg (4 mg Intravenous Given 03/25/18 1504)  iohexol (OMNIPAQUE) 300 MG/ML solution 100 mL (100 mLs Intravenous Contrast Given 03/25/18 1557)  promethazine (PHENERGAN) injection 12.5 mg (12.5 mg Intravenous Given 03/25/18 1743)    Mobility walks Low fall risk   Focused Assessments    R Recommendations: See Admitting Provider Note  Report given to:   Additional Notes:

## 2018-03-25 NOTE — ED Provider Notes (Signed)
Physical Exam  BP 127/79 (BP Location: Right Arm)   Pulse 97   Temp 99.2 F (37.3 C) (Oral)   Resp 16   Ht 5\' 10"  (1.778 m)   Wt 66.7 kg   SpO2 99%   BMI 21.09 kg/m  Anthony Pineda is a 28 y.o. male who is here with abdominal pain n/v that started 3 days ago and has gotten worse. Patient reports drinking alcohol on a daily bases but has not been able to drink in 3 days due to the n/v and severe abdominal pain.  Physical Exam  ED Course/Procedures     Results for orders placed or performed during the hospital encounter of 03/25/18 (from the past 24 hour(s))  Lipase, blood     Status: Abnormal   Collection Time: 03/25/18  2:22 PM  Result Value Ref Range   Lipase 390 (H) 11 - 51 U/L  Comprehensive metabolic panel     Status: Abnormal   Collection Time: 03/25/18  2:22 PM  Result Value Ref Range   Sodium 129 (L) 135 - 145 mmol/L   Potassium 3.6 3.5 - 5.1 mmol/L   Chloride 93 (L) 98 - 111 mmol/L   CO2 25 22 - 32 mmol/L   Glucose, Bld 124 (H) 70 - 99 mg/dL   BUN 5 (L) 6 - 20 mg/dL   Creatinine, Ser 0.93 0.61 - 1.24 mg/dL   Calcium 8.6 (L) 8.9 - 10.3 mg/dL   Total Protein 7.6 6.5 - 8.1 g/dL   Albumin 3.5 3.5 - 5.0 g/dL   AST 65 (H) 15 - 41 U/L   ALT 40 0 - 44 U/L   Alkaline Phosphatase 113 38 - 126 U/L   Total Bilirubin 1.4 (H) 0.3 - 1.2 mg/dL   GFR calc non Af Amer >60 >60 mL/min   GFR calc Af Amer >60 >60 mL/min   Anion gap 11 5 - 15  CBC     Status: Abnormal   Collection Time: 03/25/18  2:22 PM  Result Value Ref Range   WBC 12.8 (H) 4.0 - 10.5 K/uL   RBC 5.75 4.22 - 5.81 MIL/uL   Hemoglobin 17.2 (H) 13.0 - 17.0 g/dL   HCT 23.5 (H) 57.3 - 22.0 %   MCV 90.8 80.0 - 100.0 fL   MCH 29.9 26.0 - 34.0 pg   MCHC 33.0 30.0 - 36.0 g/dL   RDW 25.4 27.0 - 62.3 %   Platelets 71 (L) 150 - 400 K/uL   nRBC 0.0 0.0 - 0.2 %  Urinalysis, Routine w reflex microscopic     Status: Abnormal   Collection Time: 03/25/18  2:30 PM  Result Value Ref Range   Color, Urine AMBER (A) YELLOW    APPearance HAZY (A) CLEAR   Specific Gravity, Urine 1.020 1.005 - 1.030   pH 5.0 5.0 - 8.0   Glucose, UA NEGATIVE NEGATIVE mg/dL   Hgb urine dipstick NEGATIVE NEGATIVE   Bilirubin Urine NEGATIVE NEGATIVE   Ketones, ur 5 (A) NEGATIVE mg/dL   Protein, ur 30 (A) NEGATIVE mg/dL   Nitrite NEGATIVE NEGATIVE   Leukocytes,Ua NEGATIVE NEGATIVE   RBC / HPF 0-5 0 - 5 RBC/hpf   WBC, UA 6-10 0 - 5 WBC/hpf   Bacteria, UA NONE SEEN NONE SEEN   Mucus PRESENT    Hyaline Casts, UA PRESENT   Protime-INR     Status: None   Collection Time: 03/25/18  4:53 PM  Result Value Ref Range   Prothrombin Time 14.3 11.4 -  15.2 seconds   INR 1.1 0.8 - 1.2   Ct Abdomen Pelvis W Contrast  Result Date: 03/25/2018 CLINICAL DATA:  28 year old male with abdominal pain since yesterday with nausea vomiting. Pain radiating to the right flank. EXAM: CT ABDOMEN AND PELVIS WITH CONTRAST TECHNIQUE: Multidetector CT imaging of the abdomen and pelvis was performed using the standard protocol following bolus administration of intravenous contrast. CONTRAST:  OMNIPAQUE IOHEXOL 300 MG/ML  SOLN COMPARISON:  None. FINDINGS: Lower chest: Small layering pleural effusions. No pericardial effusion. Minor lung base atelectasis. Hepatobiliary: Small volume perihepatic free fluid. Preserved liver enhancement. Gallbladder within normal limits. No dilated bile ducts. Pancreas: Enlarged, heterogeneously enhancing and inflamed (series 3, image 24). Inflammation throughout the lesser sac. Pancreatic parenchymal involvement most pronounced in the body. No definite areas of pancreatic necrosis. Scattered non organized free-fluid in the abdomen, most pronounced in the left upper quadrant, at the root of the small bowel mesentery, and bilateral pericolic gutters. Spleen: Negative aside from regional inflammation which appears related to the pancreas. Adrenals/Urinary Tract: Normal adrenal glands. Bilateral renal enhancement is symmetric and normal. No  hydronephrosis. Proximal ureters appear normal. Unremarkable urinary bladder. Stomach/Bowel: Generalized mesenteric stranding throughout the abdomen, but more so in the small bowel. The appendix is within normal limits on coronal image 47. The cecum is located in the midline. The large bowel is decompressed from the splenic flexure distally. The transverse colon and right colon are mildly distended with gas and fluid. Fluid-filled but nondilated small bowel loops. Secondary inflammation of the stomach and duodenum related to the pancreas. No free air. Vascular/Lymphatic: Major arterial structures are patent and appear normal. Portal venous system including the splenic vein is patent. No lymphadenopathy. Reproductive: Negative. Other: Small to moderate pelvic free fluid with simple fluid density. Musculoskeletal: Negative. IMPRESSION: 1. Acute Pancreatitis. Generalized abdominal inflammation and scattered free fluid. No pancreatic necrosis or organized pseudocyst at this time. 2. Small layering pleural effusions. Electronically Signed   By: Odessa Fleming M.D.   On: 03/25/2018 16:28     Procedures  MDM  28 y.o. male with hx of ETOH use daily here with severe n/v and abdominal pain and CT scan showing acute pancreatitis. Consult with admitting MD and she will assume care of the patient.        Kerrie [place], [place] 03/25/18 Alison Stalling    Arby Barrette, MD 03/26/18 1214

## 2018-03-26 ENCOUNTER — Encounter (HOSPITAL_COMMUNITY): Payer: Self-pay | Admitting: *Deleted

## 2018-03-26 ENCOUNTER — Other Ambulatory Visit: Payer: Self-pay

## 2018-03-26 DIAGNOSIS — E871 Hypo-osmolality and hyponatremia: Secondary | ICD-10-CM

## 2018-03-26 DIAGNOSIS — F1020 Alcohol dependence, uncomplicated: Secondary | ICD-10-CM

## 2018-03-26 LAB — CBC
HCT: 41.8 % (ref 39.0–52.0)
Hemoglobin: 13.9 g/dL (ref 13.0–17.0)
MCH: 30.2 pg (ref 26.0–34.0)
MCHC: 33.3 g/dL (ref 30.0–36.0)
MCV: 90.9 fL (ref 80.0–100.0)
Platelets: 91 10*3/uL — ABNORMAL LOW (ref 150–400)
RBC: 4.6 MIL/uL (ref 4.22–5.81)
RDW: 12.3 % (ref 11.5–15.5)
WBC: 9.9 10*3/uL (ref 4.0–10.5)
nRBC: 0 % (ref 0.0–0.2)

## 2018-03-26 LAB — BASIC METABOLIC PANEL
Anion gap: 12 (ref 5–15)
BUN: 5 mg/dL — ABNORMAL LOW (ref 6–20)
CO2: 24 mmol/L (ref 22–32)
CREATININE: 1.01 mg/dL (ref 0.61–1.24)
Calcium: 8.1 mg/dL — ABNORMAL LOW (ref 8.9–10.3)
Chloride: 98 mmol/L (ref 98–111)
GFR calc Af Amer: >60 mL/min (ref >60)
GFR calc non Af Amer: >60 mL/min (ref >60)
Glucose, Bld: 78 mg/dL (ref 70–99)
Potassium: 3.7 mmol/L (ref 3.5–5.1)
Sodium: 134 mmol/L — ABNORMAL LOW (ref 135–145)

## 2018-03-26 LAB — LIPASE, BLOOD: Lipase: 169 U/L — ABNORMAL HIGH (ref 11–51)

## 2018-03-26 MED ORDER — INFLUENZA VAC SPLIT QUAD 0.5 ML IM SUSY
0.5000 mL | PREFILLED_SYRINGE | Freq: Once | INTRAMUSCULAR | Status: AC
  Administered 2018-03-26: 0.5 mL via INTRAMUSCULAR
  Filled 2018-03-26: qty 0.5

## 2018-03-26 MED ORDER — PNEUMOCOCCAL VAC POLYVALENT 25 MCG/0.5ML IJ INJ
0.5000 mL | INJECTION | INTRAMUSCULAR | Status: AC
  Administered 2018-03-27: 0.5 mL via INTRAMUSCULAR
  Filled 2018-03-26 (×2): qty 0.5

## 2018-03-26 NOTE — Progress Notes (Signed)
Anthony Pineda is doing better, has had NO N & V today but has had pain rated at a 5 most of the day.  He said the medicine makes it feel more tolerable but doesn't take it away.  Patient tolerated Ice chips.

## 2018-03-26 NOTE — Progress Notes (Signed)
PROGRESS NOTE    [name]  GNF:[id number] DOB: [birth year] DOA: 03/25/2018 PCP: Patient, No Pcp Per  Outpatient Specialists:   Brief Narrative: Anthony Pineda is a 28 y.o. male with no prior medical history who presents the emergency department with complaints of abdominal pain nausea and vomiting that started 3 days ago and has worsened since then.  He reports drinking alcohol on a daily basis but has not been able to drink anything in 3 days due to the nausea vomiting and severe abdominal pain.  Pain was gradual in onset since time of onset.  It is moderate intensity sharp pain around his umbilicus that radiates to the right side of his abdomen.  It is constant and worsened with palpation and movement.  Nothing makes it better.  Unable to eat or drink without vomiting.  He denies fevers, chills, diarrhea, chest pain, shortness of breath or any other concerns today.  He has had similar abdominal pain in the past but has never been this severe and never was accompanied with nausea or vomiting.  CT scan showed acute pancreatitis.  03/26/2018: Patient feels better today, but continues to report abdominal pain.  Lipase is on the downward trend.  Patient tells me that the last alcohol intake was about 3 to 4 days ago.  No withdrawal symptoms noted.  Will start patient on ice chips, and advance as tolerated.  Will still continue to watch out for possible alcohol withdrawal symptoms.   Assessment & Plan:   Principal Problem:   Acute pancreatitis Active Problems:   Hyponatremia   Alcoholism /alcohol abuse (HCC)  Acute pancreatitis:  Unable to hold anything down.  Pain is a little bit improved with IV pain medication obtained in the emergency department.  Denies any appetite at the present time.  Will admit the patient to the MedSurg floor.  Start IV fluids aggressively to 150 mL/h.  Provide pain medications.  Monitor response to treatment.  Recheck lipase in a.m. 03/26/2018: Lipase is down to 169.   Patient is no longer experiencing nausea or vomiting.  Will start ice chips, and advance as tolerated.  Hyponatremia:  Likely related to nausea and vomiting.  We will hydrate patient and recheck BMP in a.m. 03/26/2018: Sodium is 134 today.  Continue to monitor closely.  Alcoholism:  With a medical problem associated with his alcohol use to find alcoholism.  He open to discussion about quitting alcohol.  Splane to him that his pancreatitis will return if he continues to drink. 03/26/2018: Continue CIWA      DVT prophylaxis: Subcu q. heparin Code Status: Full code Family Communication:  Disposition Plan: Likely home Consults called: None  Procedures:  None  Antimicrobials:   None   Subjective: No nausea vomiting. Still reports vague abdominal pain.  Objective: Vitals:   03/25/18 1620 03/25/18 2010 03/26/18 0527 03/26/18 0909  BP: 127/79 127/90 115/71 124/80  Pulse: 97 (!) 58 93 (!) 112  Resp: Temp: 99.2 F (37.3 C) 99.1 F (37.3 C) 98.7 F (37.1 C) 98.2 F (36.8 C)  TempSrc: Oral Oral Oral Oral  SpO2: 99% 100% 100% 99%  Weight:      Height:        Intake/Output Summary (Last 24 hours) at 03/26/2018 1215 Last data filed at 03/26/2018 0527 Gross per 24 hour  Intake 1565.94 ml  Output 200 ml  Net 1365.94 ml   Filed Weights   03/25/18 1427  Weight: 66.7 kg    Examination:  General exam: Appears calm and comfortable  Respiratory system: Clear to auscultation. Respiratory effort normal. Cardiovascular system: S1 & S2 heard. Gastrointestinal system: Abdomen is nondistended, soft and nontender. No organomegaly or masses felt. Normal bowel sounds heard. Central nervous system: Alert and oriented. No focal neurological deficits. Extremities: No leg edema  Data Reviewed: I have personally reviewed following labs and imaging studies  CBC: Recent Labs  Lab 03/25/18 1422 03/26/18 0304  WBC 13.3*   12.8* 9.9  NEUTROABS 10.6*  --   HGB 17.3*    17.2* 13.9  HCT 52.1*   52.2* 41.8  MCV 90.3   90.8 90.9  PLT 68*   71* 91*   Basic Metabolic Panel: Recent Labs  Lab 03/25/18 1422 03/26/18 0304  NA 129* 134*  K 3.6 3.7  CL 93* 98  CO2 25 24  GLUCOSE 124* 78  BUN 5* 5*  CREATININE 1.00 1.01  CALCIUM 8.6* 8.1*   GFR: Estimated Creatinine Clearance: 102.7 mL/min (by C-G formula based on SCr of 1.01 mg/dL). Liver Function Tests: Recent Labs  Lab 03/25/18 1422  AST 65*  ALT 40  ALKPHOS 113  BILITOT 1.4*  PROT 7.6  ALBUMIN 3.5   Recent Labs  Lab 03/25/18 1422 03/26/18 0304  LIPASE 390* 169*   No results for input(s): AMMONIA in the last 168 hours. Coagulation Profile: Recent Labs  Lab 03/25/18 1653  INR 1.1   Cardiac Enzymes: No results for input(s): CKTOTAL, CKMB, CKMBINDEX, TROPONINI in the last 168 hours. BNP (last 3 results) No results for input(s): PROBNP in the last 8760 hours. HbA1C: No results for input(s): HGBA1C in the last 72 hours. CBG: No results for input(s): GLUCAP in the last 168 hours. Lipid Profile: No results for input(s): CHOL, HDL, LDLCALC, TRIG, CHOLHDL, LDLDIRECT in the last 72 hours. Thyroid Function Tests: No results for input(s): TSH, T4TOTAL, FREET4, T3FREE, THYROIDAB in the last 72 hours. Anemia Panel: No results for input(s): VITAMINB12, FOLATE, FERRITIN, TIBC, IRON, RETICCTPCT in the last 72 hours. Urine analysis:    Component Value Date/Time   COLORURINE AMBER (A) 03/25/2018 1430   APPEARANCEUR HAZY (A) 03/25/2018 1430   LABSPEC 1.020 03/25/2018 1430   PHURINE 5.0 03/25/2018 1430   GLUCOSEU NEGATIVE 03/25/2018 1430   HGBUR NEGATIVE 03/25/2018 1430   BILIRUBINUR NEGATIVE 03/25/2018 1430   KETONESUR 5 (A) 03/25/2018 1430   PROTEINUR 30 (A) 03/25/2018 1430   NITRITE NEGATIVE 03/25/2018 1430   LEUKOCYTESUR NEGATIVE 03/25/2018 1430   Sepsis Labs: @LABRCNTIP (procalcitonin:4,lacticidven:4)  )No results found for this or any previous visit (from the past 240 hour(s)).        Radiology Studies: Ct Abdomen Pelvis W Contrast  Result Date: 03/25/2018 CLINICAL DATA:  28 year old male with abdominal pain since yesterday with nausea vomiting. Pain radiating to the right flank. EXAM: CT ABDOMEN AND PELVIS WITH CONTRAST TECHNIQUE: Multidetector CT imaging of the abdomen and pelvis was performed using the standard protocol following bolus administration of intravenous contrast. CONTRAST:  OMNIPAQUE IOHEXOL 300 MG/ML  SOLN COMPARISON:  None. FINDINGS: Lower chest: Small layering pleural effusions. No pericardial effusion. Minor lung base atelectasis. Hepatobiliary: Small volume perihepatic free fluid. Preserved liver enhancement. Gallbladder within normal limits. No dilated bile ducts. Pancreas: Enlarged, heterogeneously enhancing and inflamed (series 3, image 24). Inflammation throughout the lesser sac. Pancreatic parenchymal involvement most pronounced in the body. No definite areas of pancreatic necrosis. Scattered non organized free-fluid in the abdomen, most pronounced in the left upper quadrant, at the root of the  small bowel mesentery, and bilateral pericolic gutters. Spleen: Negative aside from regional inflammation which appears related to the pancreas. Adrenals/Urinary Tract: Normal adrenal glands. Bilateral renal enhancement is symmetric and normal. No hydronephrosis. Proximal ureters appear normal. Unremarkable urinary bladder. Stomach/Bowel: Generalized mesenteric stranding throughout the abdomen, but more so in the small bowel. The appendix is within normal limits on coronal image 47. The cecum is located in the midline. The large bowel is decompressed from the splenic flexure distally. The transverse colon and right colon are mildly distended with gas and fluid. Fluid-filled but nondilated small bowel loops. Secondary inflammation of the stomach and duodenum related to the pancreas. No free air. Vascular/Lymphatic: Major arterial structures are patent and appear  normal. Portal venous system including the splenic vein is patent. No lymphadenopathy. Reproductive: Negative. Other: Small to moderate pelvic free fluid with simple fluid density. Musculoskeletal: Negative. IMPRESSION: 1. Acute Pancreatitis. Generalized abdominal inflammation and scattered free fluid. No pancreatic necrosis or organized pseudocyst at this time. 2. Small layering pleural effusions. Electronically Signed   By: Odessa Fleming M.D.   On: 03/25/2018 16:28        Scheduled Meds:  folic acid  1 mg Oral Daily   heparin  5,000 Units Subcutaneous Q8H   Influenza vac split quadrivalent PF  0.5 mL Intramuscular Once   LORazepam  0-4 mg Intravenous Q6H   Followed by   Melene Muller ON 03/27/2018] LORazepam  0-4 mg Intravenous Q12H   multivitamin with minerals  1 tablet Oral Daily   [START ON 03/27/2018] pneumococcal 23 valent vaccine  0.5 mL Intramuscular Tomorrow-1000   sodium chloride flush  3 mL Intravenous Q12H   thiamine  100 mg Oral Daily   Or   thiamine  100 mg Intravenous Daily   Continuous Infusions:  sodium chloride     lactated ringers with kcl 150 mL/hr at 03/26/18 0527     LOS: 1 day    Time spent: 25 Minutes    Berton Mount, MD  [facility] Pager #: [phone] 7PM-7AM contact night coverage as above

## 2018-03-26 NOTE — Plan of Care (Signed)

## 2018-03-27 LAB — CBC WITH DIFFERENTIAL/PLATELET
Abs Immature Granulocytes: 0.03 10*3/uL (ref 0.00–0.07)
Basophils Absolute: 0 10*3/uL (ref 0.0–0.1)
Basophils Relative: 0 %
Eosinophils Absolute: 0.1 10*3/uL (ref 0.0–0.5)
Eosinophils Relative: 1 %
HCT: 34.2 % — ABNORMAL LOW (ref 39.0–52.0)
Hemoglobin: 11.4 g/dL — ABNORMAL LOW (ref 13.0–17.0)
Immature Granulocytes: 0 %
Lymphocytes Relative: 14 %
Lymphs Abs: 1.3 10*3/uL (ref 0.7–4.0)
MCH: 30.6 pg (ref 26.0–34.0)
MCHC: 33.3 g/dL (ref 30.0–36.0)
MCV: 91.7 fL (ref 80.0–100.0)
Monocytes Absolute: 1.3 10*3/uL — ABNORMAL HIGH (ref 0.1–1.0)
Monocytes Relative: 14 %
Neutro Abs: 6.3 10*3/uL (ref 1.7–7.7)
Neutrophils Relative %: 71 %
Platelets: 148 10*3/uL — ABNORMAL LOW (ref 150–400)
RBC: 3.73 MIL/uL — ABNORMAL LOW (ref 4.22–5.81)
RDW: 12.3 % (ref 11.5–15.5)
WBC: 9 10*3/uL (ref 4.0–10.5)
nRBC: 0 % (ref 0.0–0.2)

## 2018-03-27 LAB — COMPREHENSIVE METABOLIC PANEL
ALT: 37 U/L (ref 0–44)
AST: 59 U/L — ABNORMAL HIGH (ref 15–41)
Albumin: 2.5 g/dL — ABNORMAL LOW (ref 3.5–5.0)
Alkaline Phosphatase: 90 U/L (ref 38–126)
Anion gap: 8 (ref 5–15)
BUN: 5 mg/dL — ABNORMAL LOW (ref 6–20)
CO2: 22 mmol/L (ref 22–32)
Calcium: 8.3 mg/dL — ABNORMAL LOW (ref 8.9–10.3)
Chloride: 103 mmol/L (ref 98–111)
Creatinine, Ser: 0.88 mg/dL (ref 0.61–1.24)
GFR calc Af Amer: >60 mL/min (ref >60)
GFR calc non Af Amer: >60 mL/min (ref >60)
Glucose, Bld: 78 mg/dL (ref 70–99)
Potassium: 3.9 mmol/L (ref 3.5–5.1)
Sodium: 133 mmol/L — ABNORMAL LOW (ref 135–145)
Total Bilirubin: 1.6 mg/dL — ABNORMAL HIGH (ref 0.3–1.2)
Total Protein: 6 g/dL — ABNORMAL LOW (ref 6.5–8.1)

## 2018-03-27 LAB — LIPASE, BLOOD: Lipase: 124 U/L — ABNORMAL HIGH (ref 11–51)

## 2018-03-27 MED ORDER — OXYCODONE HCL 5 MG PO TABS: 5.0000 mg | ORAL_TABLET | ORAL | 0 refills | Status: AC | PRN

## 2018-03-27 MED ORDER — OXYCODONE HCL 5 MG PO TABS: 5.0000 mg | ORAL_TABLET | ORAL | 0 refills | Status: DC | PRN

## 2018-03-27 MED ORDER — FOLIC ACID 1 MG PO TABS: 1.0000 mg | ORAL_TABLET | Freq: Every day | ORAL | 0 refills | Status: AC

## 2018-03-27 MED ORDER — THIAMINE HCL 100 MG PO TABS: 100.0000 mg | ORAL_TABLET | Freq: Every day | ORAL | 0 refills | Status: AC

## 2018-03-27 MED ORDER — ADULT MULTIVITAMIN W/MINERALS CH: 1.0000 | ORAL_TABLET | Freq: Every day | ORAL | 0 refills | Status: AC

## 2018-03-27 NOTE — Progress Notes (Signed)
Patient needs diet order in so that he may eat before discharge This is to evaluate patient to see if he is able to alterate food . Paged MD awaiting response.

## 2018-03-27 NOTE — Discharge Summary (Signed)
Physician Discharge Summary  Patient ID: Anthony Pineda MRN: [id number] DOB/AGE: September 22, 1990 28 y.o.  Admit date: 03/25/2018 Discharge date: 03/27/2018  Admission Diagnoses:  Discharge Diagnoses:  Principal Problem:   Acute pancreatitis Active Problems:   Hyponatremia   Alcoholism /alcohol abuse [facility])   Discharged Condition: stable  Hospital Course:  Anthony Pineda a 28 year old African-American malewithhistory of alcohol abuse.  Patient presented with nausea, vomiting and abdominal pain.  Work-up done revealed acute pancreatitis.  Lipase was elevated at 590 on presentation.  CT scan of the abdomen and pelvis was with contrast revealed acute pancreatitis.  Patient was admitted for further assessment and management.  Patient was managed supportively.  Patient's pain was adequately controlled.  Patient was started on CIWA protocol.  No withdrawal symptoms were not noted during the hospital stay.  Patient has improved significantly, and is back to the baseline.  Patient be discharged to the care of the primary care provider.  Patient was counseled to quit alcohol use.  Acute pancreatitis:  Kindly see above.  Hyponatremia:  Likely related to nausea and volume depletion.   Sodium level prior to discharge was 133.  Alcohol abuse: Patient was started on CIWA protocol. Patient was counseled to quit alcohol use.  Consults: None  Significant Diagnostic Studies:  Lipase on presentation was 590, was down to 124 prior to discharge.  CT scan of the abdomen and pelvis with contrast revealed "Acute Pancreatitis. Generalized abdominal inflammation and scattered free fluid. No pancreatic necrosis or organized pseudocyst at this time.  Small layering pleural effusions ".  Discharge Exam: Blood pressure 123/79, pulse (!) 101, temperature 98.6 F (37 C), temperature source Oral, resp. rate 18, height 5\' 10"  (1.778 m), weight 66.8 kg, SpO2 100 %.   Disposition: Discharge disposition:  01-Home or Self Care   Discharge Instructions    Diet - low sodium heart healthy   Complete by:  As directed    Increase activity slowly   Complete by:  As directed      Allergies as of 03/27/2018   No Known Allergies     Medication List    STOP taking these medications   ibuprofen 200 MG tablet Commonly known as:  ADVIL,MOTRIN     TAKE these medications   folic acid 1 MG tablet Commonly known as:  FOLVITE Take 1 tablet (1 mg total) by mouth daily for 30 days. Start taking on:  March 28, 2018   multivitamin with minerals Tabs tablet Take 1 tablet by mouth daily for 30 days. Start taking on:  March 28, 2018   oxyCODONE 5 MG immediate release tablet Commonly known as:  Oxy IR/ROXICODONE Take 1 tablet (5 mg total) by mouth every 4 (four) hours as needed for moderate pain.   thiamine 100 MG tablet Take 1 tablet (100 mg total) by mouth daily for 30 days. Start taking on:  March 28, 2018        Signed: Barnetta Chapel 03/27/2018, 3:04 PM

## 2018-03-27 NOTE — Progress Notes (Signed)
Patient to transitioned home today. Noted no insurance or PCP. Hospital f/u appointment scheduled at [facility] for 04/05/2018 at 1530. Information place on AVS. RN aware. No other transition of care needs noted. Bess Kinds, RN CM

## 2018-03-28 NOTE — Progress Notes (Signed)
Patient notified that his prescription for oxycodone was left here at the hospital. Educated that he must return to pick it up or we will have to shred it.  Dondra Spry, RN

## 2018-04-03 NOTE — Progress Notes (Deleted)
Patient ID: Anthony Pineda, male   DOB: December 21, 1990, 28 y.o.   MRN: [id number]   From discharge summary: Admit date: 03/25/2018 Discharge date: 03/27/2018  Admission Diagnoses:  Discharge Diagnoses:  Principal Problem:   Acute pancreatitis Active Problems:   Hyponatremia   Alcoholism /alcohol abuse [facility])   Discharged Condition: stable  Hospital Course:  Anthony Pineda a 28 year old African-American malewithhistory of alcohol abuse.  Patient presented with nausea, vomiting and abdominal pain.  Work-up done revealed acute pancreatitis.  Lipase was elevated at 590 on presentation.  CT scan of the abdomen and pelvis was with contrast revealed acute pancreatitis.  Patient was admitted for further assessment and management.  Patient was managed supportively.  Patient's pain was adequately controlled.  Patient was started on CIWA protocol.  No withdrawal symptoms were not noted during the hospital stay.  Patient has improved significantly, and is back to the baseline.  Patient be discharged to the care of the primary care provider.  Patient was counseled to quit alcohol use.  Acute pancreatitis:  Kindly see above.  Hyponatremia:  Likely related to nausea and volume depletion.   Sodium level prior to discharge was 133.  Alcohol abuse: Patient was started on CIWA protocol. Patient was counseled to quit alcohol use.  Consults: None  Significant Diagnostic Studies:  Lipase on presentation was 590, was down to 124 prior to discharge.  CT scan of the abdomen and pelvis with contrast revealed "Acute Pancreatitis. Generalized abdominal inflammation and scattered free fluid. No pancreatic necrosis or organized pseudocyst at this time.  Small layering pleural effusions ".

## 2018-04-05 ENCOUNTER — Inpatient Hospital Stay: Payer: Self-pay

## 2019-10-14 IMAGING — CT CT ABDOMEN AND PELVIS WITH CONTRAST
2 of 4 series · 15 of 46 positions shown, 17 images · IV contrast (omnipaque)
Comparison: None.

CLINICAL DATA: 27-year-old male with abdominal pain since yesterday
with nausea vomiting. Pain radiating to the right flank.

EXAM:
CT ABDOMEN AND PELVIS WITH CONTRAST
TECHNIQUE: Multidetector CT imaging of the abdomen and pelvis was performed
using the standard protocol following bolus administration of
intravenous contrast.
CONTRAST:  100mL OMNIPAQUE IOHEXOL 300 MG/ML  SOLN

[Series 3: abd/ pelvis 5.0 i30f 2 · axial · 0.62mm/px · z∈[+818,+1228]mm · 12 of 94 slices shown, 14 images]
[im 8/94  soft-tissue]
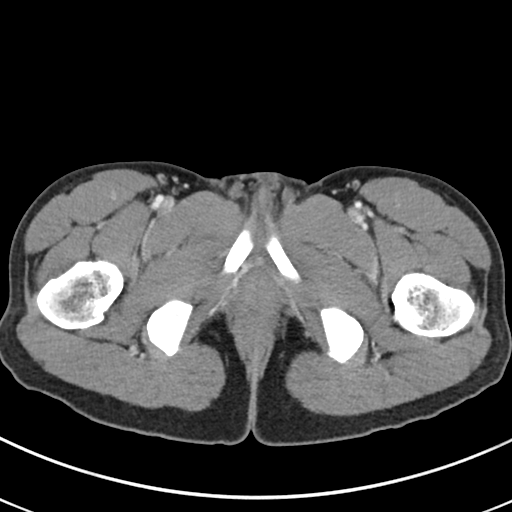
[im 8/94  bone]
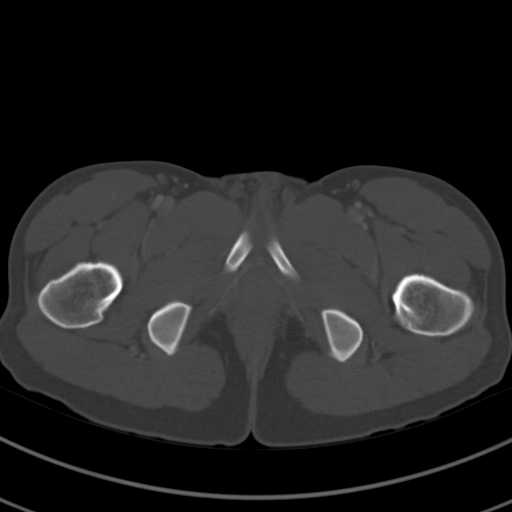
[im 15/94  soft-tissue]
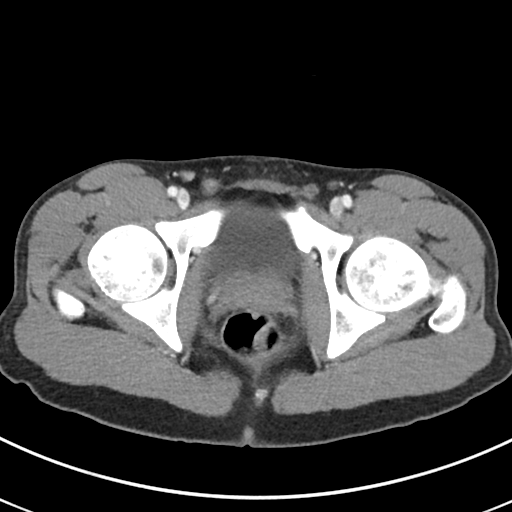
[im 23/94  soft-tissue]
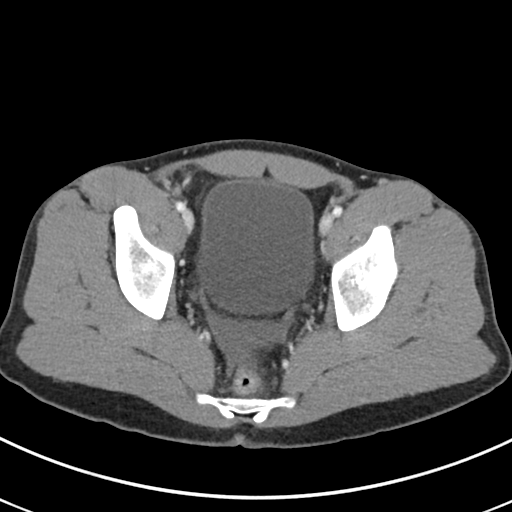
[im 30/94  soft-tissue]
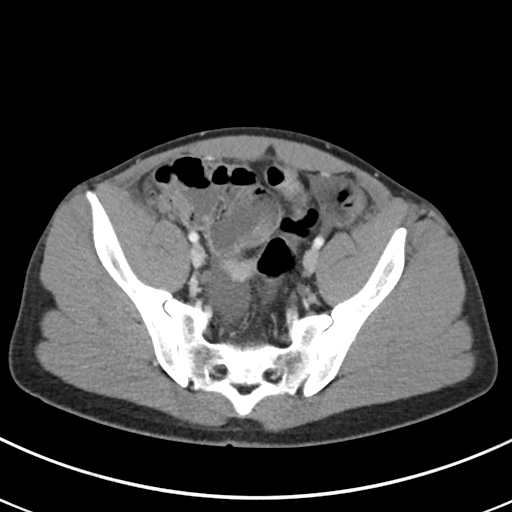
[im 38/94  soft-tissue]
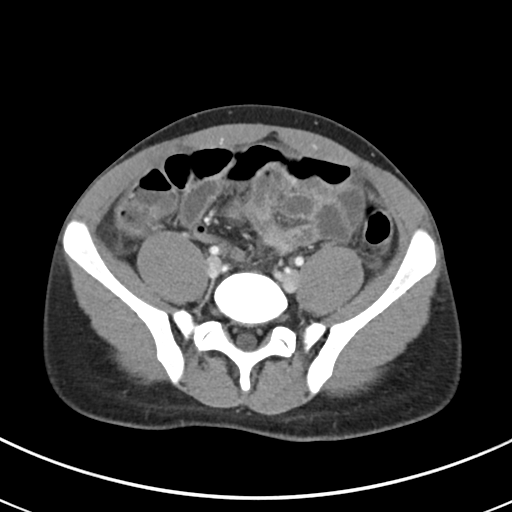
[im 45/94  soft-tissue]
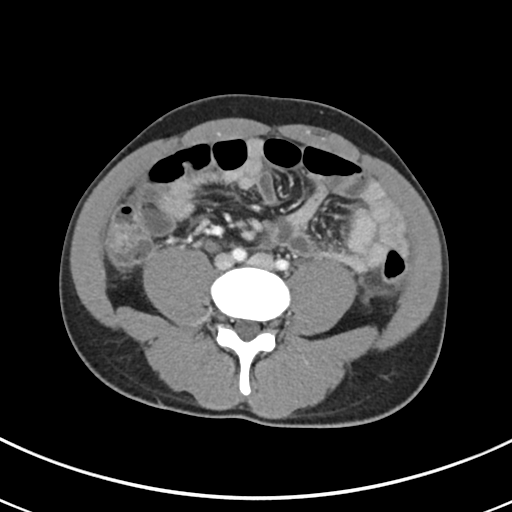
[im 53/94  soft-tissue]
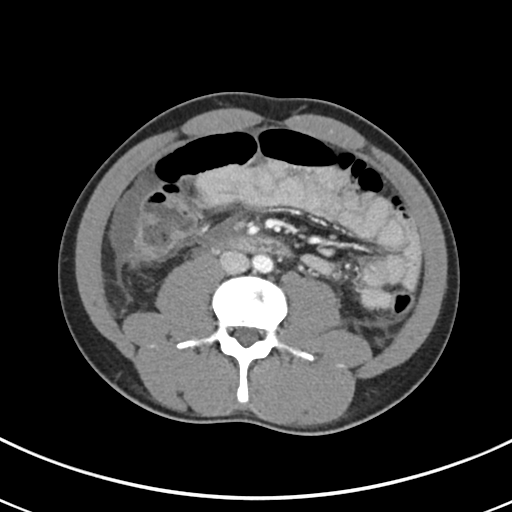
[im 60/94  soft-tissue]
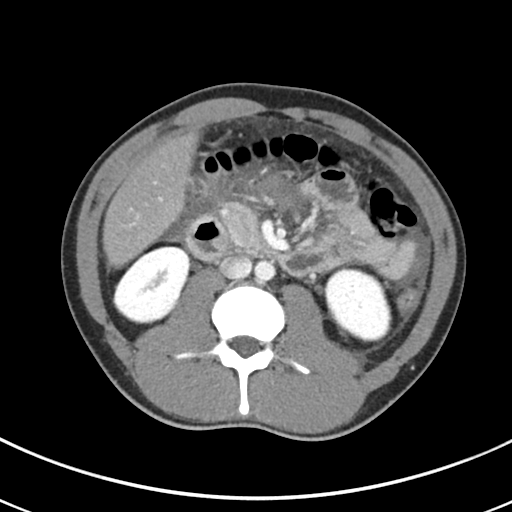
[im 67/94  soft-tissue]
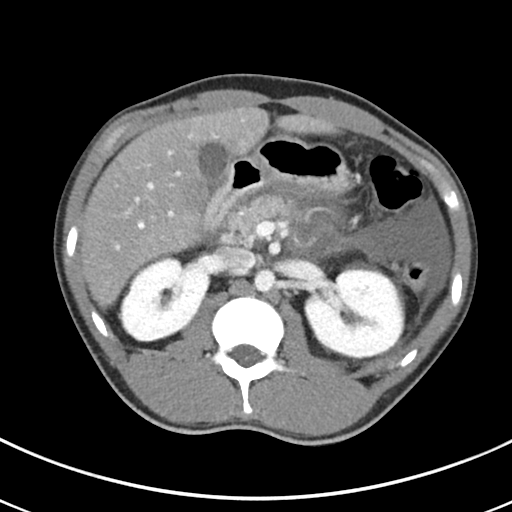
[im 67/94  bone]
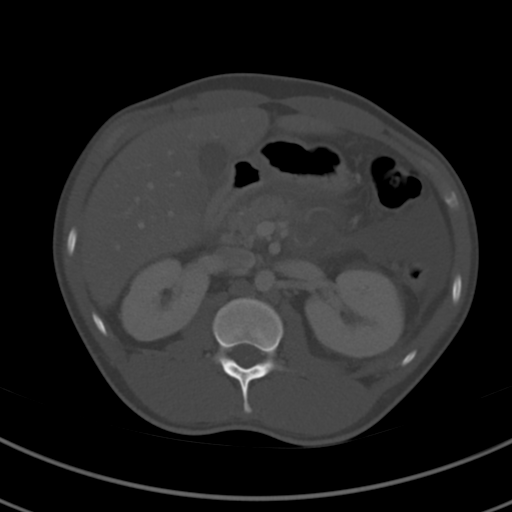
[im 75/94  soft-tissue]
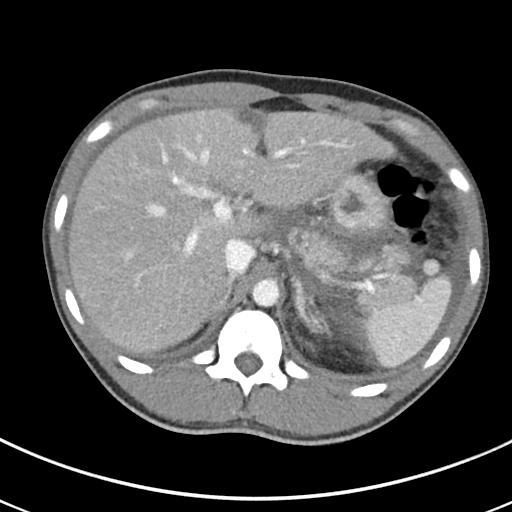
[im 82/94  soft-tissue]
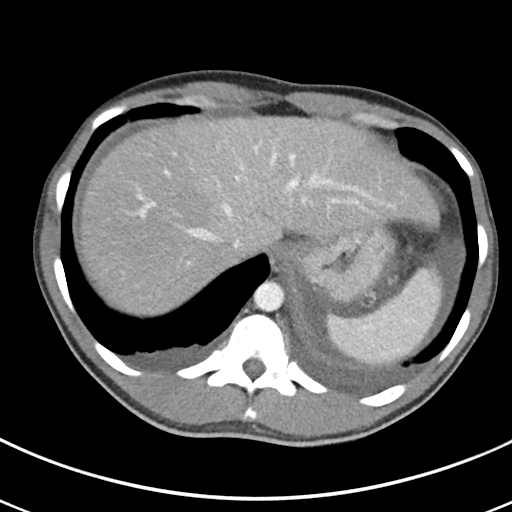
[im 90/94  soft-tissue]
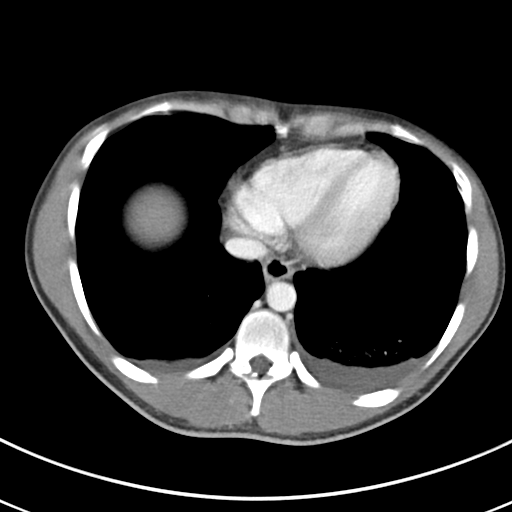

[Series 6: coronal soft tissue · coronal · 0.69mm/px · 3 of 98 slices shown]
[im 33/98  soft-tissue]
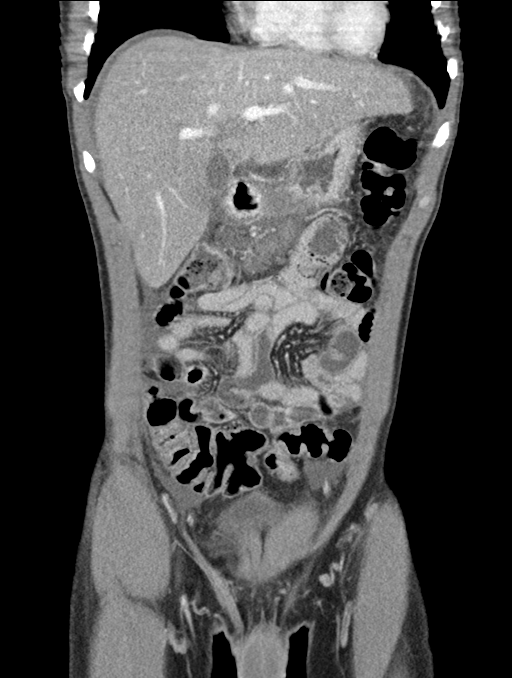
[im 44/98  soft-tissue]
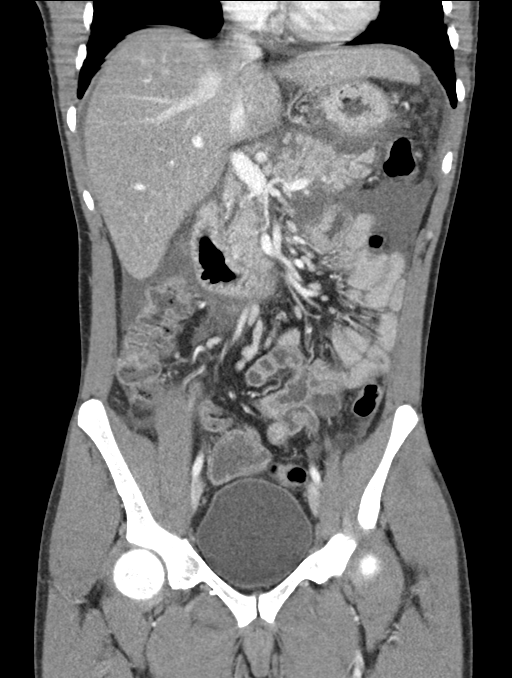
[im 54/98  soft-tissue]
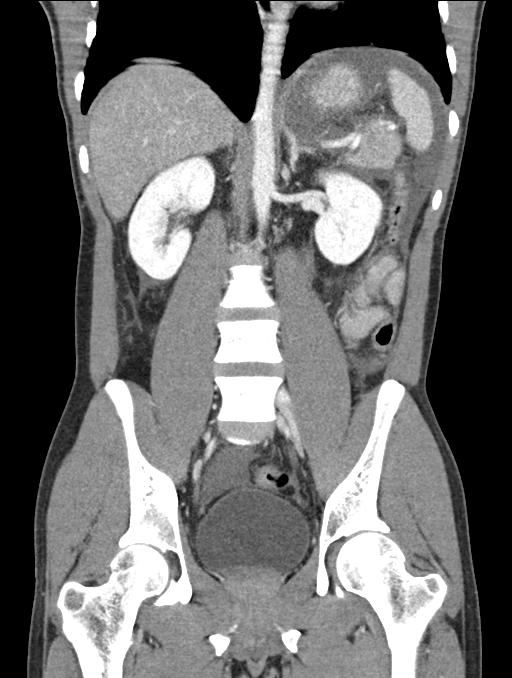

[15 of 46 positions shown; findings below may reference images not displayed]

FINDINGS: Lower chest: Small layering pleural effusions. No pericardial
effusion. Minor lung base atelectasis.

Hepatobiliary: Small volume perihepatic free fluid. Preserved liver
enhancement. Gallbladder within normal limits. No dilated bile
ducts.

Pancreas: Enlarged, heterogeneously enhancing and inflamed (series
3, image 24). Inflammation throughout the lesser sac. Pancreatic
parenchymal involvement most pronounced in the body. No definite
areas of pancreatic necrosis. Scattered non organized free-fluid in
the abdomen, most pronounced in the left upper quadrant, at the root
of the small bowel mesentery, and bilateral pericolic gutters.

Spleen: Negative aside from regional inflammation which appears
related to the pancreas.

Adrenals/Urinary Tract: Normal adrenal glands. Bilateral renal
enhancement is symmetric and normal. No hydronephrosis. Proximal
ureters appear normal. Unremarkable urinary bladder.

Stomach/Bowel: Generalized mesenteric stranding throughout the
abdomen, but more so in the small bowel. The appendix is within
normal limits on coronal image 47. The cecum is located in the
midline.

The large bowel is decompressed from the splenic flexure distally.
The transverse colon and right colon are mildly distended with gas
and fluid. Fluid-filled but nondilated small bowel loops. Secondary
inflammation of the stomach and duodenum related to the pancreas.

No free air.

Vascular/Lymphatic: Major arterial structures are patent and appear
normal. Portal venous system including the splenic vein is patent.

No lymphadenopathy.

Reproductive: Negative.

Other: Small to moderate pelvic free fluid with simple fluid
density.

Musculoskeletal: Negative.
IMPRESSION: 1. Acute Pancreatitis.
Generalized abdominal inflammation and scattered free fluid. No
pancreatic necrosis or organized pseudocyst at this time.
2. Small layering pleural effusions.
